# Patient Record
Sex: Female | Born: 1985 | Race: White | Hispanic: No | Marital: Married | Smoking: Never smoker
Health system: Southern US, Community
[De-identification: ages and names within clinical notes are randomized; demographics above are authoritative.]

## PROBLEM LIST (undated history)

## (undated) DIAGNOSIS — R51 Headache: Secondary | ICD-10-CM

## (undated) DIAGNOSIS — N390 Urinary tract infection, site not specified: Secondary | ICD-10-CM

## (undated) HISTORY — DX: Headache: R51

## (undated) HISTORY — DX: Urinary tract infection, site not specified: N39.0

---
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
## 2005-10-29 ENCOUNTER — Other Ambulatory Visit: Admission: RE | Admit: 2005-10-29 | Discharge: 2005-10-29 | Payer: Self-pay | Admitting: Obstetrics and Gynecology

## 2005-12-17 ENCOUNTER — Emergency Department (HOSPITAL_COMMUNITY): Admission: EM | Admit: 2005-12-17 | Discharge: 2005-12-17 | Payer: Self-pay | Admitting: Family Medicine

## 2006-12-09 ENCOUNTER — Other Ambulatory Visit: Admission: RE | Admit: 2006-12-09 | Discharge: 2006-12-09 | Payer: Self-pay | Admitting: Obstetrics and Gynecology

## 2011-01-01 ENCOUNTER — Encounter: Payer: Self-pay | Admitting: Family Medicine

## 2011-01-01 ENCOUNTER — Ambulatory Visit (INDEPENDENT_AMBULATORY_CARE_PROVIDER_SITE_OTHER): Payer: BC Managed Care – PPO | Admitting: Family Medicine

## 2011-01-01 DIAGNOSIS — J309 Allergic rhinitis, unspecified: Secondary | ICD-10-CM

## 2011-01-01 DIAGNOSIS — F419 Anxiety disorder, unspecified: Secondary | ICD-10-CM

## 2011-01-01 DIAGNOSIS — J302 Other seasonal allergic rhinitis: Secondary | ICD-10-CM | POA: Insufficient documentation

## 2011-01-01 DIAGNOSIS — F411 Generalized anxiety disorder: Secondary | ICD-10-CM

## 2011-01-01 DIAGNOSIS — F988 Other specified behavioral and emotional disorders with onset usually occurring in childhood and adolescence: Secondary | ICD-10-CM

## 2011-01-01 DIAGNOSIS — G44209 Tension-type headache, unspecified, not intractable: Secondary | ICD-10-CM

## 2011-01-01 DIAGNOSIS — J45909 Unspecified asthma, uncomplicated: Secondary | ICD-10-CM

## 2011-01-01 MED ORDER — KETOPROFEN 50 MG PO CAPS: 50.0000 mg | ORAL_CAPSULE | Freq: Three times a day (TID) | ORAL | Status: DC | PRN

## 2011-01-01 MED ORDER — CLONAZEPAM 0.5 MG PO TABS: 0.5000 mg | ORAL_TABLET | ORAL | Status: DC | PRN

## 2011-01-01 MED ORDER — OLOPATADINE HCL 0.6 % NA SOLN: 2.0000 | Freq: Two times a day (BID) | NASAL | Status: DC

## 2011-01-01 MED ORDER — ALBUTEROL SULFATE HFA 108 (90 BASE) MCG/ACT IN AERS: 2.0000 | INHALATION_SPRAY | RESPIRATORY_TRACT | Status: AC | PRN

## 2011-01-01 MED ORDER — LEVOCETIRIZINE DIHYDROCHLORIDE 5 MG PO TABS: 5.0000 mg | ORAL_TABLET | Freq: Every day | ORAL | Status: DC

## 2011-01-01 MED ORDER — AMPHETAMINE-DEXTROAMPHETAMINE 5 MG PO TABS: 5.0000 mg | ORAL_TABLET | Freq: Every day | ORAL | Status: DC

## 2011-01-01 MED ORDER — CYCLOBENZAPRINE HCL 10 MG PO TABS: 10.0000 mg | ORAL_TABLET | Freq: Three times a day (TID) | ORAL | Status: DC | PRN

## 2011-01-01 MED ORDER — DESOGEST-ETH ESTRAD TRIPHASIC 0.1/0.125/0.15 -0.025 MG PO TABS: 1.0000 | ORAL_TABLET | Freq: Every day | ORAL | Status: DC

## 2011-01-01 MED ORDER — AMPHETAMINE-DEXTROAMPHET ER 15 MG PO CP24: 15.0000 mg | ORAL_CAPSULE | Freq: Every day | ORAL | Status: DC

## 2011-01-01 NOTE — Assessment & Plan Note (Signed)
Well controlled since starting 5-HTP.  Using Klonopin rarely.  Seeing therapist.  Will follow.

## 2011-01-01 NOTE — Progress Notes (Signed)
  Subjective:    Patient ID: Karen Taylor, female    DOB: 11-18-1985, 25 y.o.   MRN: [id number]  HPI ADD- on Adderall daily.  dx'd in 2006.  Was previously on 20mg - had 'jittery feeling', dose was decreased to 10mg  and increased to 15 mg 1 yr ago.  Denies current palpitations, insomnia.  Feels sxs are well controlled but will note that she loses focus ~4 pm while working at [employer].  Had discussed addition of 5mg  tab for afternoon sxs w/ previous MD.  Anxiety- on Klonopin prn for sxs.  Rarely taking meds.  sxs started in elementary school but wasn't started on meds until 2004.  Is seeing therapist, taking 5-HTP daily which has dramatically reduced need for Klonopin.  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  On Flexeril and Ketoprofen.  Feels NSAID works better than muscle relaxer.  Reports HAs are well controlled.  Seasonal Allergies- taking Xyzal and Flonase daily.  Having itchy, watery eyes- unable to wear contacts.  Has both spring and fall allergies.  Asthma- rarely uses rescue inhaler, had childhood asthma, seems to have outgrown this for the most part unless has URI.   Review of Systems For ROS see HPI     Objective:   Physical Exam  Constitutional: She is oriented to person, place, and time. She appears well-developed and well-nourished. No distress.  HENT:  Head: Normocephalic and atraumatic.  Eyes: Conjunctivae and EOM are normal. Pupils are equal, round, and reactive to light. Right eye exhibits no discharge. Left eye exhibits no discharge.  Neck: Normal range of motion. Neck supple. No thyromegaly present.  Cardiovascular: Normal rate, regular rhythm, normal heart sounds and intact distal pulses.   No murmur heard. Pulmonary/Chest: Effort normal and breath sounds normal. No respiratory distress. She has no wheezes.  Musculoskeletal: She exhibits no edema.  Lymphadenopathy:    She has no cervical adenopathy.  Neurological: She is alert and oriented to  person, place, and time.  Skin: Skin is warm and dry.  Psychiatric: She has a normal mood and affect. Her behavior is normal. Judgment and thought content normal.          Assessment & Plan:

## 2011-01-01 NOTE — Patient Instructions (Signed)
Schedule your complete physical at your convenience- do not eat before this appt Start the nasal spray to treat both your nasal and eye allergies Add the Adderall 5mg  for afternoon concentration Call with any questions or concerns Welcome!  We're glad to have you!

## 2011-01-01 NOTE — Assessment & Plan Note (Signed)
Pt's nasal allergies well controlled on Flonase and Xyzal but her eye allergies remain problematic.  Pt prefers to avoid eye drops so that she can wear her contacts.  Switch to patanase to control both eye and nasal allergies.

## 2011-01-01 NOTE — Assessment & Plan Note (Signed)
Only problematic for pt when she has URI.  Refill on rescue inhaler given.

## 2011-01-01 NOTE — Assessment & Plan Note (Signed)
Pt's medication seems to wear off daily ~4 pm.  Pt had discussed adding short acting med w/ previous MD.  This seems reasonable as she had side effects when her long acting med was increased.  Will follow closely.

## 2011-01-01 NOTE — Assessment & Plan Note (Signed)
HAs improve w/ NSAID use prn.  Will occasionally use muscle relaxer on nights/weekends as needed.  Feels HAs are well controlled.

## 2011-02-16 ENCOUNTER — Encounter: Payer: BC Managed Care – PPO | Admitting: Family Medicine

## 2011-12-11 ENCOUNTER — Telehealth: Payer: Self-pay | Admitting: Family Medicine

## 2011-12-11 NOTE — Telephone Encounter (Signed)
Refill for   Levocetirizine  (Tab)  5 MG  Take 1 tablet (5 mg total) by mouth daily. Qty 30 Last fill date 11/14/11

## 2011-12-12 NOTE — Telephone Encounter (Signed)
Last OV noted 01-01-11 no upcoming apt noted, PCP change noted on 12-11-11,please note

## 2011-12-12 NOTE — Telephone Encounter (Signed)
Ok for #30, no refills.  Remind her she needs appt

## 2011-12-13 MED ORDER — LEVOCETIRIZINE DIHYDROCHLORIDE 5 MG PO TABS: 5.0000 mg | ORAL_TABLET | Freq: Every day | ORAL | Status: DC

## 2011-12-13 NOTE — Telephone Encounter (Signed)
rx sent to pharmacy by e-script for #30 Letter has been mailed to pt address noted in the chart to advise they are overdue for cpe/ov/labs and the pt needs to contact office to set up appt    

## 2011-12-19 ENCOUNTER — Ambulatory Visit (INDEPENDENT_AMBULATORY_CARE_PROVIDER_SITE_OTHER): Payer: BC Managed Care – PPO | Admitting: Internal Medicine

## 2011-12-19 ENCOUNTER — Other Ambulatory Visit: Payer: Self-pay | Admitting: Family Medicine

## 2011-12-19 ENCOUNTER — Encounter: Payer: Self-pay | Admitting: Internal Medicine

## 2011-12-19 VITALS — BP 126/90 | HR 127 | Temp 99.7°F | Wt 272.0 lb

## 2011-12-19 DIAGNOSIS — J029 Acute pharyngitis, unspecified: Secondary | ICD-10-CM

## 2011-12-19 DIAGNOSIS — J020 Streptococcal pharyngitis: Secondary | ICD-10-CM

## 2011-12-19 DIAGNOSIS — R000 Tachycardia, unspecified: Secondary | ICD-10-CM

## 2011-12-19 LAB — POCT RAPID STREP A (OFFICE): Rapid Strep A Screen: POSITIVE — AB

## 2011-12-19 MED ORDER — CEPHALEXIN 500 MG PO CAPS: 500.0000 mg | ORAL_CAPSULE | Freq: Two times a day (BID) | ORAL | Status: AC

## 2011-12-19 NOTE — Patient Instructions (Addendum)
Zicam Melts or Zinc lozenges ; vitamin C 2000 mg daily; & Echinacea for 4-7 days.    NSAIDS ( Aleve, Advil, Naproxen) or Tylenol every 4 hrs as needed for fever as discussed based on label recommendations . Eight oz of fluid every hour while awake.   Avoid stimulants such as decongestants, diet pills, ADD medication  or caffeine (coffee, tea, cola, or chocolate) to excess because of fast heart rate.      Please remain out of work until  12/24/11.

## 2011-12-19 NOTE — Progress Notes (Signed)
  Subjective:    Patient ID: Karen Taylor, female    DOB: 1986/06/16, 26 y.o.   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 She's had some diarrhea but attributes this to being unable to eat.      Objective:   Physical Exam General appearance:well nourished; no acute distress or increased work of breathing is present. IN NO DISTRESS  Tender L posterior cervical  lymphadenopathy ; none noted in  axilla    Eyes: No conjunctival inflammation or lid edema is present.   Ears:  External ear exam shows no significant lesions or deformities.  Otoscopic examination reveals clear canals, tympanic membranes are intact bilaterally without bulging, retraction, inflammation or discharge.  Nose:  External nasal examination shows no deformity or inflammation. Nasal mucosa are pink and moist without lesions or exudates. No septal dislocation or deviation.No obstruction to airflow.   Oral exam: Dental hygiene is good; lips and gums are healthy appearing.There is no uvular erythema w/o exudate  Neck:   Supple with full range of motion without pain.   Heart:  TACHYCARDIA; regular rhythm. Pulse recheck : 115.S1 and S2 normal without  murmur, click, rub .Gallop cadence Lungs:Chest clear to auscultation; no wheezes,  rhonchi,rales ,or rubs present.No increased work of breathing.    Extremities:  No cyanosis, edema, or clubbing  noted    Skin: Warm & dry w/o jaundice or tenting.          Assessment & Plan:   #1 strep pharyngitis with intermittent fever and chills  #2 tachycardia without significant murmur to suggest endocarditis  Plan: See orders and recommendations

## 2011-12-19 NOTE — Telephone Encounter (Signed)
Patient called scheduled CPE for May, can we send a prescription for    desogestrel-ethinyl estradiol (CYCLESSA) tablet    Qty 28  To  [id number] - [place], [place] - [address] To get her thru til may

## 2011-12-20 MED ORDER — DESOGEST-ETH ESTRAD TRIPHASIC 0.1/0.125/0.15 -0.025 MG PO TABS: 1.0000 | ORAL_TABLET | Freq: Every day | ORAL | Status: DC

## 2011-12-20 NOTE — Telephone Encounter (Signed)
rx sent to pharmacy by e-script for #30 with 1 refill to last patient til upcoming OV in 5-13

## 2012-02-08 ENCOUNTER — Encounter: Payer: BC Managed Care – PPO | Admitting: Family Medicine

## 2012-03-14 ENCOUNTER — Encounter: Payer: BC Managed Care – PPO | Admitting: Family Medicine

## 2012-12-16 ENCOUNTER — Ambulatory Visit: Payer: BC Managed Care – PPO | Attending: Orthopedic Surgery | Admitting: Physical Therapy

## 2012-12-16 DIAGNOSIS — IMO0001 Reserved for inherently not codable concepts without codable children: Secondary | ICD-10-CM | POA: Insufficient documentation

## 2012-12-16 DIAGNOSIS — M25569 Pain in unspecified knee: Secondary | ICD-10-CM | POA: Insufficient documentation

## 2012-12-23 ENCOUNTER — Ambulatory Visit: Payer: BC Managed Care – PPO | Admitting: Physical Therapy

## 2012-12-30 ENCOUNTER — Ambulatory Visit: Payer: BC Managed Care – PPO | Attending: Orthopedic Surgery | Admitting: Physical Therapy

## 2012-12-30 DIAGNOSIS — IMO0001 Reserved for inherently not codable concepts without codable children: Secondary | ICD-10-CM | POA: Insufficient documentation

## 2012-12-30 DIAGNOSIS — M25569 Pain in unspecified knee: Secondary | ICD-10-CM | POA: Insufficient documentation

## 2013-01-06 ENCOUNTER — Ambulatory Visit: Payer: BC Managed Care – PPO | Admitting: Physical Therapy

## 2015-03-27 ENCOUNTER — Encounter (HOSPITAL_COMMUNITY): Payer: Self-pay | Admitting: Emergency Medicine

## 2015-03-27 ENCOUNTER — Emergency Department (HOSPITAL_COMMUNITY): Payer: 59

## 2015-03-27 ENCOUNTER — Observation Stay (HOSPITAL_COMMUNITY)
Admission: EM | Admit: 2015-03-27 | Discharge: 2015-03-29 | Disposition: A | Discharge status: Home / Self Care | Payer: 59 | Attending: Surgery | Admitting: Surgery

## 2015-03-27 DIAGNOSIS — K810 Acute cholecystitis: Secondary | ICD-10-CM | POA: Diagnosis present

## 2015-03-27 DIAGNOSIS — K8010 Calculus of gallbladder with chronic cholecystitis without obstruction: Secondary | ICD-10-CM | POA: Diagnosis present

## 2015-03-27 DIAGNOSIS — K819 Cholecystitis, unspecified: Secondary | ICD-10-CM

## 2015-03-27 DIAGNOSIS — K8000 Calculus of gallbladder with acute cholecystitis without obstruction: Principal | ICD-10-CM | POA: Insufficient documentation

## 2015-03-27 DIAGNOSIS — Z6841 Body Mass Index (BMI) 40.0 and over, adult: Secondary | ICD-10-CM | POA: Insufficient documentation

## 2015-03-27 LAB — COMPREHENSIVE METABOLIC PANEL
ALBUMIN: 3.6 g/dL (ref 3.5–5.0)
ALT: 20 U/L (ref 14–54)
ANION GAP: 9 (ref 5–15)
AST: 23 U/L (ref 15–41)
Alkaline Phosphatase: 69 U/L (ref 38–126)
BILIRUBIN TOTAL: 0.3 mg/dL (ref 0.3–1.2)
BUN: 9 mg/dL (ref 6–20)
CHLORIDE: 104 mmol/L (ref 101–111)
CO2: 25 mmol/L (ref 22–32)
Calcium: 9.1 mg/dL (ref 8.9–10.3)
Creatinine, Ser: 0.76 mg/dL (ref 0.44–1.00)
GFR calc Af Amer: >60 mL/min (ref >60)
Glucose, Bld: 100 mg/dL — ABNORMAL HIGH (ref 65–99)
Potassium: 3.8 mmol/L (ref 3.5–5.1)
Sodium: 138 mmol/L (ref 135–145)
Total Protein: 6.7 g/dL (ref 6.5–8.1)

## 2015-03-27 LAB — CBC WITH DIFFERENTIAL/PLATELET
BASOS PCT: 0 % (ref 0–1)
Basophils Absolute: 0 10*3/uL (ref 0.0–0.1)
EOS ABS: 0.2 10*3/uL (ref 0.0–0.7)
EOS PCT: 2 % (ref 0–5)
HCT: 40.7 % (ref 36.0–46.0)
HEMOGLOBIN: 13.5 g/dL (ref 12.0–15.0)
Lymphocytes Relative: 31 % (ref 12–46)
Lymphs Abs: 3.1 10*3/uL (ref 0.7–4.0)
MCH: 29.3 pg (ref 26.0–34.0)
MCHC: 33.2 g/dL (ref 30.0–36.0)
MCV: 88.3 fL (ref 78.0–100.0)
MONO ABS: 0.8 10*3/uL (ref 0.1–1.0)
Monocytes Relative: 8 % (ref 3–12)
Neutro Abs: 6 10*3/uL (ref 1.7–7.7)
Neutrophils Relative %: 59 % (ref 43–77)
Platelets: 429 10*3/uL — ABNORMAL HIGH (ref 150–400)
RBC: 4.61 MIL/uL (ref 3.87–5.11)
RDW: 14.9 % (ref 11.5–15.5)
WBC: 10.2 10*3/uL (ref 4.0–10.5)

## 2015-03-27 LAB — SURGICAL PCR SCREEN
MRSA, PCR: NEGATIVE
Staphylococcus aureus: NEGATIVE

## 2015-03-27 LAB — CREATININE, SERUM
Creatinine, Ser: 0.7 mg/dL (ref 0.44–1.00)
GFR calc Af Amer: >60 mL/min (ref >60)
GFR calc non Af Amer: >60 mL/min (ref >60)

## 2015-03-27 LAB — URINALYSIS, ROUTINE W REFLEX MICROSCOPIC
Bilirubin Urine: NEGATIVE
Glucose, UA: NEGATIVE mg/dL
Hgb urine dipstick: NEGATIVE
Ketones, ur: NEGATIVE mg/dL
Leukocytes, UA: NEGATIVE
Nitrite: NEGATIVE
Protein, ur: NEGATIVE mg/dL
Specific Gravity, Urine: 1.025 (ref 1.005–1.030)
Urobilinogen, UA: 0.2 mg/dL (ref 0.0–1.0)
pH: 6 (ref 5.0–8.0)

## 2015-03-27 LAB — CBC
HCT: 41.1 % (ref 36.0–46.0)
Hemoglobin: 13.5 g/dL (ref 12.0–15.0)
MCH: 28.8 pg (ref 26.0–34.0)
MCHC: 32.8 g/dL (ref 30.0–36.0)
MCV: 87.8 fL (ref 78.0–100.0)
Platelets: 420 10*3/uL — ABNORMAL HIGH (ref 150–400)
RBC: 4.68 MIL/uL (ref 3.87–5.11)
RDW: 15 % (ref 11.5–15.5)
WBC: 13.7 10*3/uL — ABNORMAL HIGH (ref 4.0–10.5)

## 2015-03-27 LAB — LIPASE, BLOOD: Lipase: 20 U/L — ABNORMAL LOW (ref 22–51)

## 2015-03-27 LAB — POC URINE PREG, ED: PREG TEST UR: NEGATIVE

## 2015-03-27 MED ORDER — ACETAMINOPHEN 325 MG PO TABS
650.0000 mg | ORAL_TABLET | Freq: Four times a day (QID) | ORAL | Status: DC | PRN
  Administered 2015-03-28: 650 mg via ORAL
  Filled 2015-03-27: qty 2

## 2015-03-27 MED ORDER — FENTANYL CITRATE (PF) 100 MCG/2ML IJ SOLN
100.0000 ug | Freq: Once | INTRAMUSCULAR | Status: AC
  Administered 2015-03-27: 100 ug via INTRAVENOUS
  Filled 2015-03-27: qty 2

## 2015-03-27 MED ORDER — CIPROFLOXACIN IN D5W 400 MG/200ML IV SOLN
400.0000 mg | Freq: Two times a day (BID) | INTRAVENOUS | Status: DC
  Administered 2015-03-27 – 2015-03-29 (×4): 400 mg via INTRAVENOUS
  Filled 2015-03-27 (×6): qty 200

## 2015-03-27 MED ORDER — OXYCODONE HCL 5 MG PO TABS
5.0000 mg | ORAL_TABLET | ORAL | Status: DC | PRN
  Administered 2015-03-29 (×2): 5 mg via ORAL
  Filled 2015-03-27 (×2): qty 1

## 2015-03-27 MED ORDER — ONDANSETRON HCL 4 MG/2ML IJ SOLN
4.0000 mg | Freq: Four times a day (QID) | INTRAMUSCULAR | Status: DC | PRN
  Administered 2015-03-27: 4 mg via INTRAVENOUS
  Filled 2015-03-27: qty 2

## 2015-03-27 MED ORDER — IOHEXOL 300 MG/ML  SOLN
100.0000 mL | Freq: Once | INTRAMUSCULAR | Status: AC | PRN
  Administered 2015-03-27: 100 mL via INTRAVENOUS

## 2015-03-27 MED ORDER — ENOXAPARIN SODIUM 40 MG/0.4ML ~~LOC~~ SOLN: 40.0000 mg | SUBCUTANEOUS | Status: DC

## 2015-03-27 MED ORDER — HYDROMORPHONE HCL 1 MG/ML IJ SOLN
1.0000 mg | INTRAMUSCULAR | Status: DC | PRN
  Administered 2015-03-27 – 2015-03-29 (×11): 1 mg via INTRAVENOUS
  Filled 2015-03-27 (×11): qty 1

## 2015-03-27 MED ORDER — ACETAMINOPHEN 650 MG RE SUPP: 650.0000 mg | Freq: Four times a day (QID) | RECTAL | Status: DC | PRN

## 2015-03-27 MED ORDER — KCL IN DEXTROSE-NACL 20-5-0.9 MEQ/L-%-% IV SOLN
INTRAVENOUS | Status: DC
  Administered 2015-03-27 – 2015-03-28 (×2): via INTRAVENOUS
  Filled 2015-03-27 (×6): qty 1000

## 2015-03-27 MED ORDER — ONDANSETRON HCL 4 MG/2ML IJ SOLN
4.0000 mg | Freq: Once | INTRAMUSCULAR | Status: AC
  Administered 2015-03-27: 4 mg via INTRAVENOUS
  Filled 2015-03-27: qty 2

## 2015-03-27 MED ORDER — SODIUM CHLORIDE 0.9 % IV BOLUS (SEPSIS)
1000.0000 mL | Freq: Once | INTRAVENOUS | Status: AC
  Administered 2015-03-27: 1000 mL via INTRAVENOUS

## 2015-03-27 MED ORDER — MORPHINE SULFATE 4 MG/ML IJ SOLN
4.0000 mg | Freq: Once | INTRAMUSCULAR | Status: AC
  Administered 2015-03-27: 4 mg via INTRAVENOUS
  Filled 2015-03-27: qty 1

## 2015-03-27 NOTE — ED Notes (Signed)
Pt states- I woke up about 5am with sharp right lower abdominal pain that has gotten worse since then with 1 episode of vomiting. Last BM today, normal.  Afebrile. Pt rates pain 7/10 constant and took two ibuprofen pta but then vomited after.

## 2015-03-27 NOTE — ED Notes (Signed)
Pt requesting pain and nausea medication. Attempted to notify PA.

## 2015-03-27 NOTE — ED Notes (Signed)
Pt transporting to CT  

## 2015-03-27 NOTE — ED Provider Notes (Signed)
CSN: [id number]     Arrival date & time 03/27/15  0730 History   First MD Initiated Contact with Patient 03/27/15 [phone]     Chief Complaint  Patient presents with  . Abdominal Pain     (Consider location/radiation/quality/duration/timing/severity/associated sxs/prior Treatment) HPI Patient presents to the emergency department with sudden onset of right-sided abdominal pain around 5 AM the patient states that she was oblique in by the pain at 5 AM she states that it was mainly right-sided abdominal pain.  She states that she did not have nausea and 1 episode of vomiting.  Patient states she had a bowel movement this morning, attempting to relieve her symptoms without improvement.  The patient states that she does not have any chest pain, shortness of breath, back pain, neck pain, fever, dysuria, hematuria, incontinence, weakness, dizziness, or syncope.  The patient states that she did not take any medications prior to arrival for her symptoms Past Medical History  Diagnosis Date  . Asthma   . Headache(784.0)   . UTI (lower urinary tract infection)   . Migraine    Past Surgical History  Procedure Laterality Date  . Nasal septum surgery  2003  . Wisdom tooth extraction  2005  . Tonsillectomy and adenoidectomy  1994   Family History  Problem Relation Age of Onset  . Sudden death Mother   . Mental illness Mother     bi-polar  . Diabetes Maternal Grandfather    History  Substance Use Topics  . Smoking status: Never Smoker   . Smokeless tobacco: Not on file  . Alcohol Use: Yes     Comment: wine on weekends   OB History    No data available     Review of Systems  All other systems negative except as documented in the HPI. All pertinent positives and negatives as reviewed in the HPI.  Allergies  Review of patient's allergies indicates no known allergies.  Home Medications   Prior to Admission medications   Medication Sig Start Date End Date Taking? Authorizing Provider   albuterol (PROAIR HFA) 108 (90 BASE) MCG/ACT inhaler Inhale 2 puffs into the lungs as needed. 01/01/11  Yes Sheliah Hatch, MD  ibuprofen (ADVIL,MOTRIN) 200 MG tablet Take 400 mg by mouth every 6 (six) hours as needed for mild pain or moderate pain.   Yes Historical Provider, MD   BP 139/85 mmHg  Pulse 100  Temp(Src) 98.6 F (37 C) (Oral)  Resp 19  Ht 5\' 8"  (1.727 m)  Wt 272 lb (123.378 kg)  BMI 41.37 kg/m2  SpO2 97%  LMP 03/06/2015 Physical Exam  Constitutional: She is oriented to person, place, and time. She appears well-developed and well-nourished. No distress.  HENT:  Head: Normocephalic and atraumatic.  Mouth/Throat: Oropharynx is clear and moist.  Eyes: Pupils are equal, round, and reactive to light.  Neck: Normal range of motion. Neck supple.  Cardiovascular: Normal rate, regular rhythm and normal heart sounds.  Exam reveals no gallop and no friction rub.   No murmur heard. Pulmonary/Chest: Effort normal and breath sounds normal. No respiratory distress.  Musculoskeletal: She exhibits no edema.  Neurological: She is alert and oriented to person, place, and time. She exhibits normal muscle tone. Coordination normal.  Skin: Skin is warm and dry. No rash noted. No erythema.  Psychiatric: She has a normal mood and affect. Her behavior is normal.  Nursing note and vitals reviewed.   ED Course  Procedures (including critical care time) Labs Review Labs  Reviewed  CBC WITH DIFFERENTIAL/PLATELET - Abnormal; Notable for the following:    Platelets 429 (*)    All other components within normal limits  COMPREHENSIVE METABOLIC PANEL - Abnormal; Notable for the following:    Glucose, Bld 100 (*)    All other components within normal limits  LIPASE, BLOOD - Abnormal; Notable for the following:    Lipase 20 (*)    All other components within normal limits  URINALYSIS, ROUTINE W REFLEX MICROSCOPIC (NOT AT [facility]) - Abnormal; Notable for the following:    APPearance CLOUDY (*)     All other components within normal limits  POC URINE PREG, ED    Imaging Review US Abdomen Complete  03/27/2015   CLINICAL DATA:  Right upper quadrant pain  EXAM: ULTRASOUND ABDOMEN COMPLETE  COMPARISON:  None.  FINDINGS: Gallbladder: Stone within the neck of gallbladder measures 1.6 cm. Gallbladder wall is upper limits of normal in thickness measuring 3 mm. No sonographic Murphy sign noted.  Common bile duct: Diameter: 3 mm.  Liver: No focal lesion identified. Within normal limits in parenchymal echogenicity.  IVC: No abnormality visualized.  Pancreas: Visualized portion unremarkable.  Spleen: Size and appearance within normal limits.  Right Kidney: Length: 11.2 cm. Mild pelviectasis. Echogenicity within normal limits. No mass or hydronephrosis visualized.  Left Kidney: Length: 12.2 cm. Echogenicity within normal limits. No mass or hydronephrosis visualized.  Abdominal aorta: No aneurysm visualized.  Other findings: None.  IMPRESSION: 1. Gallstone noted within the gallbladder neck. The gallbladder wall is upper limits of normal in thickness measuring 3 mm. Correlate for any clinical signs or symptoms of cholecystitis. 2. Gallstones.   Electronically Signed   By: Signa Kell M.D.   On: 03/27/2015 12:03   Ct Abdomen Pelvis W Contrast  03/27/2015   CLINICAL DATA:  Right lower quadrant abdominal pain.  Vomiting.  EXAM: CT ABDOMEN AND PELVIS WITH CONTRAST  TECHNIQUE: Multidetector CT imaging of the abdomen and pelvis was performed using the standard protocol following bolus administration of intravenous contrast.  CONTRAST:  OMNIPAQUE IOHEXOL 300 MG/ML  SOLN  COMPARISON:  None.  FINDINGS: Normal hepatic contour. No discrete hepatic lesions. Normal appearance of the gallbladder. No radiopaque gallstones. No intra extrahepatic biliary duct dilatation. No ascites.  There is symmetric enhancement of the bilateral kidneys. No renal stones on this postcontrast examination. No discrete renal lesions. No  urinary obstruction or perinephric stranding. Normal appearance of the bilateral adrenal glands, pancreas and spleen. Incidental note is made of several small splenules.  The bowel is normal in course and caliber without wall thickening or evidence of obstruction. Normal appearance appendix. No pneumoperitoneum, pneumatosis or portal venous gas.  Normal caliber the abdominal aorta. The major branch vessels of the abdominal aorta appear patent on this non CTA examination.  Scattered retroperitoneal lymph nodes individually not enlarged by size criteria. No bulky retroperitoneal, mesenteric, pelvic or inguinal lymphadenopathy.  Note is made of a approximately 1.3 x 1.6 cm left-sided presumably physiologic adnexal cyst (image 85, series 201). No discrete right-sided adnexal lesions. No free fluid the pelvic cul-de-sac. Several phleboliths are seen the lower pelvis bilaterally. Normal appearance of the urinary bladder given degree distention.  Limited visualization the lower thorax demonstrates minimal dependent ground-glass atelectasis. No discrete focal airspace opacities. No pleural effusion.  Normal heart size.  No pericardial effusion.  No acute or aggressive osseous abnormalities. Tiny mesenteric fat containing periumbilical hernia.  IMPRESSION: 1. No explanation for patient's right lower quadrant abdominal pain. Specifically, no  evidence of enteric or urinary obstruction. Normal appearance of the appendix. 2. Incidentally noted approximately 1.6 cm left-sided presumably physiologic adnexal cyst.   Electronically Signed   By: Simonne Come M.D.   On: 03/27/2015 10:22    I spoke with general surgery about the patient and they will be down to evaluate her, I got an ultrasound following the CT scan due to the fact that she had right-sided abdominal pain and did have some upper abdominal pain as well.  The patient.  Patient did have gallstones, along with a gallstone stuck in the gallbladder neck with mild  inflammation of the gallbladder, a is reexamined 2) on each reexamination, her pain is better but is still present  Charlestine Night, PA-C 03/27/15 1344  Benjiman Core, MD 03/27/15 1529

## 2015-03-27 NOTE — H&P (Signed)
Karen Taylor is an 29 y.o. female.   Chief Complaint: RUQ abdominal pain since 4 am  HPI: asked to see the patient at the request of Lawyer PA for RUQ pain abdomne sice 4 am.  Found to have gallstones on [place] with borderline GB wall thickening.  Pain sharp severe in RUQ  made better with pain meds but returns when meds  Wear off.  Had [place] steak sandwich at dinner.  First time this has happened.   Past Medical History  Diagnosis Date  . Asthma   . Headache(784.0)   . UTI (lower urinary tract infection)   . Migraine     Past Surgical History  Procedure Laterality Date  . Nasal septum surgery  2003  . Wisdom tooth extraction  2005  . Tonsillectomy and adenoidectomy  1994    Family History  Problem Relation Age of Onset  . Sudden death Mother   . Mental illness Mother     bi-polar  . Diabetes Maternal Grandfather    Social History:  reports that she has never smoked. She does not have any smokeless tobacco history on file. She reports that she drinks alcohol. She reports that she does not use illicit drugs.  Allergies: No Known Allergies   (Not in a hospital admission)  Results for orders placed or performed during the hospital encounter of 03/27/15 (from the past 48 hour(s))  CBC with Differential     Status: Abnormal   Collection Time: 03/27/15  7:52 AM  Result Value Ref Range   WBC 10.2 4.0 - 10.5 K/uL   RBC 4.61 3.87 - 5.11 MIL/uL   Hemoglobin 13.5 12.0 - 15.0 g/dL   HCT 40.7 36.0 - 46.0 %   MCV 88.3 78.0 - 100.0 fL   MCH 29.3 26.0 - 34.0 pg   MCHC 33.2 30.0 - 36.0 g/dL   RDW 14.9 11.5 - 15.5 %   Platelets 429 (H) 150 - 400 K/uL   Neutrophils Relative % 59 43 - 77 %   Neutro Abs 6.0 1.7 - 7.7 K/uL   Lymphocytes Relative 31 12 - 46 %   Lymphs Abs 3.1 0.7 - 4.0 K/uL   Monocytes Relative 8 3 - 12 %   Monocytes Absolute 0.8 0.1 - 1.0 K/uL   Eosinophils Relative 2 0 - 5 %   Eosinophils Absolute 0.2 0.0 - 0.7 K/uL   Basophils Relative 0 0 - 1 %   Basophils Absolute  0.0 0.0 - 0.1 K/uL  Comprehensive metabolic panel     Status: Abnormal   Collection Time: 03/27/15  7:52 AM  Result Value Ref Range   Sodium 138 135 - 145 mmol/L   Potassium 3.8 3.5 - 5.1 mmol/L   Chloride 104 101 - 111 mmol/L   CO2 25 22 - 32 mmol/L   Glucose, Bld 100 (H) 65 - 99 mg/dL   BUN 9 6 - 20 mg/dL   Creatinine, Ser 0.76 0.44 - 1.00 mg/dL   Calcium 9.1 8.9 - 10.3 mg/dL   Total Protein 6.7 6.5 - 8.1 g/dL   Albumin 3.6 3.5 - 5.0 g/dL   AST 23 15 - 41 U/L   ALT 20 14 - 54 U/L   Alkaline Phosphatase 69 38 - 126 U/L   Total Bilirubin 0.3 0.3 - 1.2 mg/dL   GFR calc non Af Amer >60 >60 mL/min   GFR calc Af Amer >60 >60 mL/min    Comment: (NOTE) The eGFR has been calculated using the  CKD EPI equation. This calculation has not been validated in all clinical situations. eGFR's persistently <60 mL/min signify possible Chronic Kidney Disease.    Anion gap 9 5 - 15  Urinalysis, Routine w reflex microscopic (not at [facility])     Status: Abnormal   Collection Time: 03/27/15  8:00 AM  Result Value Ref Range   Color, Urine YELLOW YELLOW   APPearance CLOUDY (A) CLEAR   Specific Gravity, Urine 1.025 1.005 - 1.030   pH 6.0 5.0 - 8.0   Glucose, UA NEGATIVE NEGATIVE mg/dL   Hgb urine dipstick NEGATIVE NEGATIVE   Bilirubin Urine NEGATIVE NEGATIVE   Ketones, ur NEGATIVE NEGATIVE mg/dL   Protein, ur NEGATIVE NEGATIVE mg/dL   Urobilinogen, UA 0.2 0.0 - 1.0 mg/dL   Nitrite NEGATIVE NEGATIVE   Leukocytes, UA NEGATIVE NEGATIVE    Comment: MICROSCOPIC NOT DONE ON URINES WITH NEGATIVE PROTEIN, BLOOD, LEUKOCYTES, NITRITE, OR GLUCOSE <1000 mg/dL.  POC Urine Pregnancy, ED  (If Pre-menopausal female)  not at [facility]     Status: None   Collection Time: 03/27/15  8:10 AM  Result Value Ref Range   Preg Test, Ur NEGATIVE NEGATIVE    Comment:        THE SENSITIVITY OF THIS METHODOLOGY IS >24 mIU/mL   Lipase, blood     Status: Abnormal   Collection Time: 03/27/15 10:30 AM  Result Value Ref Range    Lipase 20 (L) 22 - 51 U/L   US Abdomen Complete  03/27/2015   CLINICAL DATA:  Right upper quadrant pain  EXAM: ULTRASOUND ABDOMEN COMPLETE  COMPARISON:  None.  FINDINGS: Gallbladder: Stone within the neck of gallbladder measures 1.6 cm. Gallbladder wall is upper limits of normal in thickness measuring 3 mm. No sonographic Murphy sign noted.  Common bile duct: Diameter: 3 mm.  Liver: No focal lesion identified. Within normal limits in parenchymal echogenicity.  IVC: No abnormality visualized.  Pancreas: Visualized portion unremarkable.  Spleen: Size and appearance within normal limits.  Right Kidney: Length: 11.2 cm. Mild pelviectasis. Echogenicity within normal limits. No mass or hydronephrosis visualized.  Left Kidney: Length: 12.2 cm. Echogenicity within normal limits. No mass or hydronephrosis visualized.  Abdominal aorta: No aneurysm visualized.  Other findings: None.  IMPRESSION: 1. Gallstone noted within the gallbladder neck. The gallbladder wall is upper limits of normal in thickness measuring 3 mm. Correlate for any clinical signs or symptoms of cholecystitis. 2. Gallstones.   Electronically Signed   By: Kerby Moors M.D.   On: 03/27/2015 12:03   Ct Abdomen Pelvis W Contrast  03/27/2015   CLINICAL DATA:  Right lower quadrant abdominal pain.  Vomiting.  EXAM: CT ABDOMEN AND PELVIS WITH CONTRAST  TECHNIQUE: Multidetector CT imaging of the abdomen and pelvis was performed using the standard protocol following bolus administration of intravenous contrast.  CONTRAST:  170mL OMNIPAQUE IOHEXOL 300 MG/ML  SOLN  COMPARISON:  None.  FINDINGS: Normal hepatic contour. No discrete hepatic lesions. Normal appearance of the gallbladder. No radiopaque gallstones. No intra extrahepatic biliary duct dilatation. No ascites.  There is symmetric enhancement of the bilateral kidneys. No renal stones on this postcontrast examination. No discrete renal lesions. No urinary obstruction or perinephric stranding. Normal  appearance of the bilateral adrenal glands, pancreas and spleen. Incidental note is made of several small splenules.  The bowel is normal in course and caliber without wall thickening or evidence of obstruction. Normal appearance appendix. No pneumoperitoneum, pneumatosis or portal venous gas.  Normal caliber the abdominal aorta. The  major branch vessels of the abdominal aorta appear patent on this non CTA examination.  Scattered retroperitoneal lymph nodes individually not enlarged by size criteria. No bulky retroperitoneal, mesenteric, pelvic or inguinal lymphadenopathy.  Note is made of a approximately 1.3 x 1.6 cm left-sided presumably physiologic adnexal cyst (image 85, series 201). No discrete right-sided adnexal lesions. No free fluid the pelvic cul-de-sac. Several phleboliths are seen the lower pelvis bilaterally. Normal appearance of the urinary bladder given degree distention.  Limited visualization the lower thorax demonstrates minimal dependent ground-glass atelectasis. No discrete focal airspace opacities. No pleural effusion.  Normal heart size.  No pericardial effusion.  No acute or aggressive osseous abnormalities. Tiny mesenteric fat containing periumbilical hernia.  IMPRESSION: 1. No explanation for patient's right lower quadrant abdominal pain. Specifically, no evidence of enteric or urinary obstruction. Normal appearance of the appendix. 2. Incidentally noted approximately 1.6 cm left-sided presumably physiologic adnexal cyst.   Electronically Signed   By: Sandi Mariscal M.D.   On: 03/27/2015 10:22    Review of Systems  Constitutional: Negative for fever and chills.  HENT: Negative.   Respiratory: Negative.   Cardiovascular: Negative.   Gastrointestinal: Positive for nausea, vomiting and abdominal pain.  Musculoskeletal: Negative.   Skin: Negative.   Psychiatric/Behavioral: Negative.     Blood pressure 139/85, pulse 100, temperature 98.6 F (37 C), temperature source Oral, resp. rate  19, height $RemoveBe'5\' 8"'BKoubavZs$  (1.727 m), weight 123.378 kg (272 lb), last menstrual period 03/06/2015, SpO2 97 %. Physical Exam  Constitutional: She is oriented to person, place, and time. She appears well-developed and well-nourished.  HENT:  Head: Normocephalic and atraumatic.  Eyes: No scleral icterus.  Neck: Normal range of motion. Neck supple.  Cardiovascular: Normal rate.   Respiratory: Effort normal.  GI: There is tenderness. There is positive Murphy's sign.  Musculoskeletal: Normal range of motion.  Neurological: She is alert and oriented to person, place, and time.  Skin: Skin is warm and dry.  Psychiatric: She has a normal mood and affect. Her behavior is normal. Judgment and thought content normal.     Assessment/Plan Acute cholecystitis Admit ABX Lap cholecystectomy moday   Rosella Crandell A. 03/27/2015, 2:03 PM

## 2015-03-28 ENCOUNTER — Observation Stay (HOSPITAL_COMMUNITY): Payer: 59 | Admitting: Anesthesiology

## 2015-03-28 ENCOUNTER — Encounter (HOSPITAL_COMMUNITY)
Admission: EM | Disposition: A | Discharge status: Home / Self Care | Payer: Self-pay | Source: Home / Self Care | Attending: Emergency Medicine

## 2015-03-28 ENCOUNTER — Observation Stay (HOSPITAL_COMMUNITY): Payer: 59

## 2015-03-28 ENCOUNTER — Encounter (HOSPITAL_COMMUNITY): Payer: Self-pay | Admitting: Certified Registered Nurse Anesthetist

## 2015-03-28 HISTORY — PX: CHOLECYSTECTOMY: SHX55

## 2015-03-28 LAB — CBC
HCT: 38.9 % (ref 36.0–46.0)
Hemoglobin: 12.6 g/dL (ref 12.0–15.0)
MCH: 29 pg (ref 26.0–34.0)
MCHC: 32.4 g/dL (ref 30.0–36.0)
MCV: 89.4 fL (ref 78.0–100.0)
Platelets: 397 10*3/uL (ref 150–400)
RBC: 4.35 MIL/uL (ref 3.87–5.11)
RDW: 15.3 % (ref 11.5–15.5)
WBC: 15.3 10*3/uL — ABNORMAL HIGH (ref 4.0–10.5)

## 2015-03-28 LAB — COMPREHENSIVE METABOLIC PANEL
ALT: 37 U/L (ref 14–54)
AST: 40 U/L (ref 15–41)
Albumin: 3.3 g/dL — ABNORMAL LOW (ref 3.5–5.0)
Alkaline Phosphatase: 71 U/L (ref 38–126)
Anion gap: 6 (ref 5–15)
CO2: 27 mmol/L (ref 22–32)
Calcium: 8.7 mg/dL — ABNORMAL LOW (ref 8.9–10.3)
Chloride: 101 mmol/L (ref 101–111)
Creatinine, Ser: 0.81 mg/dL (ref 0.44–1.00)
GFR calc non Af Amer: >60 mL/min (ref >60)
Glucose, Bld: 110 mg/dL — ABNORMAL HIGH (ref 65–99)
Potassium: 4.7 mmol/L (ref 3.5–5.1)
SODIUM: 134 mmol/L — AB (ref 135–145)
TOTAL PROTEIN: 6.8 g/dL (ref 6.5–8.1)
Total Bilirubin: 0.5 mg/dL (ref 0.3–1.2)

## 2015-03-28 SURGERY — LAPAROSCOPIC CHOLECYSTECTOMY WITH INTRAOPERATIVE CHOLANGIOGRAM
Anesthesia: General | Site: Abdomen

## 2015-03-28 MED ORDER — GLYCOPYRROLATE 0.2 MG/ML IJ SOLN
INTRAMUSCULAR | Status: AC
  Filled 2015-03-28: qty 3

## 2015-03-28 MED ORDER — FENTANYL CITRATE (PF) 250 MCG/5ML IJ SOLN
INTRAMUSCULAR | Status: AC
  Filled 2015-03-28: qty 5

## 2015-03-28 MED ORDER — HYDROMORPHONE HCL 1 MG/ML IJ SOLN
INTRAMUSCULAR | Status: AC
  Administered 2015-03-28: 1 mg via INTRAVENOUS
  Filled 2015-03-28: qty 1

## 2015-03-28 MED ORDER — SUCCINYLCHOLINE CHLORIDE 20 MG/ML IJ SOLN
INTRAMUSCULAR | Status: DC | PRN
  Administered 2015-03-28: 120 mg via INTRAVENOUS

## 2015-03-28 MED ORDER — HYDROMORPHONE HCL 1 MG/ML IJ SOLN
0.2500 mg | INTRAMUSCULAR | Status: DC | PRN
  Administered 2015-03-28 (×2): 0.5 mg via INTRAVENOUS

## 2015-03-28 MED ORDER — FENTANYL CITRATE (PF) 100 MCG/2ML IJ SOLN
INTRAMUSCULAR | Status: DC | PRN
  Administered 2015-03-28 (×2): 50 ug via INTRAVENOUS
  Administered 2015-03-28: 200 ug via INTRAVENOUS
  Administered 2015-03-28: 100 ug via INTRAVENOUS
  Administered 2015-03-28 (×2): 50 ug via INTRAVENOUS

## 2015-03-28 MED ORDER — LIDOCAINE HCL (CARDIAC) 20 MG/ML IV SOLN
INTRAVENOUS | Status: DC | PRN
  Administered 2015-03-28: 40 mg via INTRAVENOUS

## 2015-03-28 MED ORDER — PHENYLEPHRINE HCL 10 MG/ML IJ SOLN
INTRAMUSCULAR | Status: DC | PRN
  Administered 2015-03-28: 40 ug via INTRAVENOUS
  Administered 2015-03-28: 80 ug via INTRAVENOUS

## 2015-03-28 MED ORDER — HEMOSTATIC AGENTS (NO CHARGE) OPTIME
TOPICAL | Status: DC | PRN
  Administered 2015-03-28: 1 via TOPICAL

## 2015-03-28 MED ORDER — ROCURONIUM BROMIDE 50 MG/5ML IV SOLN
INTRAVENOUS | Status: AC
  Filled 2015-03-28: qty 1

## 2015-03-28 MED ORDER — SODIUM CHLORIDE 0.9 % IR SOLN
Status: DC | PRN
  Administered 2015-03-28: 1000 mL

## 2015-03-28 MED ORDER — NEOSTIGMINE METHYLSULFATE 10 MG/10ML IV SOLN
INTRAVENOUS | Status: AC
  Filled 2015-03-28: qty 1

## 2015-03-28 MED ORDER — ONDANSETRON HCL 4 MG/2ML IJ SOLN: 4.0000 mg | Freq: Once | INTRAMUSCULAR | Status: DC | PRN

## 2015-03-28 MED ORDER — ONDANSETRON HCL 4 MG/2ML IJ SOLN
INTRAMUSCULAR | Status: AC
  Filled 2015-03-28: qty 2

## 2015-03-28 MED ORDER — BUPIVACAINE-EPINEPHRINE (PF) 0.25% -1:200000 IJ SOLN
INTRAMUSCULAR | Status: AC
  Filled 2015-03-28: qty 30

## 2015-03-28 MED ORDER — PROPOFOL 10 MG/ML IV BOLUS
INTRAVENOUS | Status: AC
  Filled 2015-03-28: qty 20

## 2015-03-28 MED ORDER — LACTATED RINGERS IV SOLN
INTRAVENOUS | Status: DC | PRN
  Administered 2015-03-28 (×2): via INTRAVENOUS

## 2015-03-28 MED ORDER — NEOSTIGMINE METHYLSULFATE 10 MG/10ML IV SOLN
INTRAVENOUS | Status: DC | PRN
  Administered 2015-03-28: 4 mg via INTRAVENOUS

## 2015-03-28 MED ORDER — GLYCOPYRROLATE 0.2 MG/ML IJ SOLN
INTRAMUSCULAR | Status: DC | PRN
  Administered 2015-03-28: 0.6 mg via INTRAVENOUS

## 2015-03-28 MED ORDER — ONDANSETRON HCL 4 MG/2ML IJ SOLN
INTRAMUSCULAR | Status: DC | PRN
  Administered 2015-03-28: 4 mg via INTRAVENOUS

## 2015-03-28 MED ORDER — PROPOFOL 10 MG/ML IV BOLUS
INTRAVENOUS | Status: DC | PRN
  Administered 2015-03-28: 180 mg via INTRAVENOUS
  Administered 2015-03-28: 20 mg via INTRAVENOUS

## 2015-03-28 MED ORDER — MIDAZOLAM HCL 2 MG/2ML IJ SOLN
INTRAMUSCULAR | Status: AC
  Filled 2015-03-28: qty 2

## 2015-03-28 MED ORDER — BUPIVACAINE-EPINEPHRINE 0.25% -1:200000 IJ SOLN
INTRAMUSCULAR | Status: DC | PRN
Start: 2015-03-28 — End: 2015-03-28
  Administered 2015-03-28: 17 mL

## 2015-03-28 MED ORDER — SCOPOLAMINE 1 MG/3DAYS TD PT72
MEDICATED_PATCH | TRANSDERMAL | Status: AC
  Filled 2015-03-28: qty 2

## 2015-03-28 MED ORDER — SCOPOLAMINE 1 MG/3DAYS TD PT72
MEDICATED_PATCH | TRANSDERMAL | Status: DC | PRN
  Administered 2015-03-28: 1 via TRANSDERMAL

## 2015-03-28 MED ORDER — 0.9 % SODIUM CHLORIDE (POUR BTL) OPTIME
TOPICAL | Status: DC | PRN
  Administered 2015-03-28: 1000 mL

## 2015-03-28 MED ORDER — DEXAMETHASONE SODIUM PHOSPHATE 4 MG/ML IJ SOLN
INTRAMUSCULAR | Status: DC | PRN
  Administered 2015-03-28: 8 mg via INTRAVENOUS

## 2015-03-28 MED ORDER — OXYCODONE HCL 5 MG PO TABS: 5.0000 mg | ORAL_TABLET | Freq: Once | ORAL | Status: DC | PRN

## 2015-03-28 MED ORDER — DEXAMETHASONE SODIUM PHOSPHATE 4 MG/ML IJ SOLN
INTRAMUSCULAR | Status: AC
  Filled 2015-03-28: qty 2

## 2015-03-28 MED ORDER — SODIUM CHLORIDE 0.9 % IV SOLN
INTRAVENOUS | Status: DC | PRN
  Administered 2015-03-28: 15 mL

## 2015-03-28 MED ORDER — SUCCINYLCHOLINE CHLORIDE 20 MG/ML IJ SOLN
INTRAMUSCULAR | Status: AC
  Filled 2015-03-28: qty 1

## 2015-03-28 MED ORDER — ROCURONIUM BROMIDE 100 MG/10ML IV SOLN
INTRAVENOUS | Status: DC | PRN
  Administered 2015-03-28: 5 mg via INTRAVENOUS
  Administered 2015-03-28: 40 mg via INTRAVENOUS
  Administered 2015-03-28: 5 mg via INTRAVENOUS

## 2015-03-28 MED ORDER — OXYCODONE HCL 5 MG/5ML PO SOLN: 5.0000 mg | Freq: Once | ORAL | Status: DC | PRN

## 2015-03-28 MED ORDER — PHENYLEPHRINE 40 MCG/ML (10ML) SYRINGE FOR IV PUSH (FOR BLOOD PRESSURE SUPPORT)
PREFILLED_SYRINGE | INTRAVENOUS | Status: AC
  Filled 2015-03-28: qty 10

## 2015-03-28 MED ORDER — MIDAZOLAM HCL 5 MG/5ML IJ SOLN
INTRAMUSCULAR | Status: DC | PRN
  Administered 2015-03-28: 2 mg via INTRAVENOUS

## 2015-03-28 MED ORDER — LIDOCAINE HCL (CARDIAC) 20 MG/ML IV SOLN
INTRAVENOUS | Status: AC
  Filled 2015-03-28: qty 5

## 2015-03-28 SURGICAL SUPPLY — 57 items
APL SKNCLS STERI-STRIP NONHPOA (GAUZE/BANDAGES/DRESSINGS) ×1
APPLIER CLIP ROT 10 11.4 M/L (STAPLE) ×3
APR CLP MED LRG 11.4X10 (STAPLE) ×1
BAG SPEC RTRVL LRG 6X4 10 (ENDOMECHANICALS) ×1
BENZOIN TINCTURE PRP APPL 2/3 (GAUZE/BANDAGES/DRESSINGS) ×3 IMPLANT
BLADE SURG ROTATE 9660 (MISCELLANEOUS) IMPLANT
CANISTER SUCTION 2500CC (MISCELLANEOUS) ×3 IMPLANT
CHLORAPREP W/TINT 26ML (MISCELLANEOUS) ×3 IMPLANT
CLIP APPLIE ROT 10 11.4 M/L (STAPLE) ×1 IMPLANT
COVER MAYO STAND STRL (DRAPES) ×3 IMPLANT
COVER SURGICAL LIGHT HANDLE (MISCELLANEOUS) ×3 IMPLANT
DRAPE C-ARM 42X72 X-RAY (DRAPES) ×3 IMPLANT
DRSG TEGADERM 2-3/8X2-3/4 SM (GAUZE/BANDAGES/DRESSINGS) ×9 IMPLANT
DRSG TEGADERM 4X4.75 (GAUZE/BANDAGES/DRESSINGS) ×3 IMPLANT
ELECT REM PT RETURN 9FT ADLT (ELECTROSURGICAL) ×3
ELECTRODE REM PT RTRN 9FT ADLT (ELECTROSURGICAL) ×1 IMPLANT
FILTER SMOKE EVAC LAPAROSHD (FILTER) ×3 IMPLANT
GAUZE SPONGE 2X2 8PLY STRL LF (GAUZE/BANDAGES/DRESSINGS) ×1 IMPLANT
GLOVE BIO SURGEON STRL SZ 6.5 (GLOVE) ×2 IMPLANT
GLOVE BIO SURGEON STRL SZ7 (GLOVE) ×5 IMPLANT
GLOVE BIO SURGEON STRL SZ7.5 (GLOVE) ×3 IMPLANT
GLOVE BIO SURGEON STRL SZ8 (GLOVE) ×3 IMPLANT
GLOVE BIO SURGEONS STRL SZ 6.5 (GLOVE) ×1
GLOVE BIOGEL PI IND STRL 6.5 (GLOVE) ×3 IMPLANT
GLOVE BIOGEL PI IND STRL 7.0 (GLOVE) ×1 IMPLANT
GLOVE BIOGEL PI IND STRL 7.5 (GLOVE) ×1 IMPLANT
GLOVE BIOGEL PI IND STRL 8 (GLOVE) ×1 IMPLANT
GLOVE BIOGEL PI INDICATOR 6.5 (GLOVE) ×6
GLOVE BIOGEL PI INDICATOR 7.0 (GLOVE) ×2
GLOVE BIOGEL PI INDICATOR 7.5 (GLOVE) ×4
GLOVE BIOGEL PI INDICATOR 8 (GLOVE) ×2
GOWN STRL REUS W/ TWL LRG LVL3 (GOWN DISPOSABLE) ×4 IMPLANT
GOWN STRL REUS W/ TWL XL LVL3 (GOWN DISPOSABLE) ×1 IMPLANT
GOWN STRL REUS W/TWL LRG LVL3 (GOWN DISPOSABLE) ×12
GOWN STRL REUS W/TWL XL LVL3 (GOWN DISPOSABLE) ×3
HEMOSTAT SNOW SURGICEL 2X4 (HEMOSTASIS) ×3 IMPLANT
KIT BASIN OR (CUSTOM PROCEDURE TRAY) ×3 IMPLANT
KIT ROOM TURNOVER OR (KITS) ×3 IMPLANT
NS IRRIG 1000ML POUR BTL (IV SOLUTION) ×3 IMPLANT
PAD ARMBOARD 7.5X6 YLW CONV (MISCELLANEOUS) ×3 IMPLANT
PENCIL BUTTON HOLSTER BLD 10FT (ELECTRODE) ×3 IMPLANT
POUCH SPECIMEN RETRIEVAL 10MM (ENDOMECHANICALS) ×3 IMPLANT
SCISSORS LAP 5X35 DISP (ENDOMECHANICALS) ×3 IMPLANT
SET CHOLANGIOGRAPH 5 50 .035 (SET/KITS/TRAYS/PACK) ×3 IMPLANT
SET IRRIG TUBING LAPAROSCOPIC (IRRIGATION / IRRIGATOR) ×3 IMPLANT
SLEEVE ENDOPATH XCEL 5M (ENDOMECHANICALS) ×3 IMPLANT
SPECIMEN JAR MEDIUM (MISCELLANEOUS) ×3 IMPLANT
SPECIMEN JAR SMALL (MISCELLANEOUS) IMPLANT
SPONGE GAUZE 2X2 STER 10/PKG (GAUZE/BANDAGES/DRESSINGS) ×2
SUT MNCRL AB 4-0 PS2 18 (SUTURE) ×3 IMPLANT
TOWEL OR 17X24 6PK STRL BLUE (TOWEL DISPOSABLE) ×3 IMPLANT
TOWEL OR 17X26 10 PK STRL BLUE (TOWEL DISPOSABLE) IMPLANT
TRAY LAPAROSCOPIC (CUSTOM PROCEDURE TRAY) ×3 IMPLANT
TROCAR XCEL BLUNT TIP 100MML (ENDOMECHANICALS) ×3 IMPLANT
TROCAR XCEL NON-BLD 11X100MML (ENDOMECHANICALS) ×3 IMPLANT
TROCAR XCEL NON-BLD 5MMX100MML (ENDOMECHANICALS) ×3 IMPLANT
TUBING INSUFFLATION (TUBING) ×3 IMPLANT

## 2015-03-28 NOTE — Op Note (Signed)
Laparoscopic Cholecystectomy with IOC Procedure Note  Indications: This patient presents with symptomatic gallbladder disease and will undergo laparoscopic cholecystectomy.  Pre-operative Diagnosis: Calculus of gallbladder with acute cholecystitis, without mention of obstruction  Post-operative Diagnosis: Same  Surgeon: Iretha Kirley K.   Assistants: Dr. Violeta GelinasBurke Thompson, MD  Anesthesia: General endotracheal anesthesia  ASA Class: 1E  Procedure Details  The patient was seen again in the Holding Room. The risks, benefits, complications, treatment options, and expected outcomes were discussed with the patient. The possibilities of reaction to medication, pulmonary aspiration, perforation of viscus, bleeding, recurrent infection, finding a normal gallbladder, the need for additional procedures, failure to diagnose a condition, the possible need to convert to an open procedure, and creating a complication requiring transfusion or operation were discussed with the patient. The likelihood of improving the patient's symptoms with return to their baseline status is good.  The patient and/or family concurred with the proposed plan, giving informed consent. The site of surgery properly noted. The patient was taken to Operating Room, identified as Karen Taylor and the procedure verified as Laparoscopic Cholecystectomy with Intraoperative Cholangiogram. A Time Out was held and the above information confirmed.  Prior to the induction of general anesthesia, antibiotic prophylaxis was administered. General endotracheal anesthesia was then administered and tolerated well. After the induction, the abdomen was prepped with Chloraprep and draped in the sterile fashion. The patient was positioned in the supine position.  Local anesthetic agent was injected into the skin above the umbilicus and an incision made. We dissected down to the abdominal fascia with blunt dissection.  The fascia was incised vertically and we  entered the peritoneal cavity bluntly.  A pursestring suture of 0-Vicryl was placed around the fascial opening.  The Hasson cannula was inserted and secured with the stay suture.  Pneumoperitoneum was then created with CO2 and tolerated well without any adverse changes in the patient's vital signs. An 11-mm port was placed in the subxiphoid position.  Two 5-mm ports were placed in the right upper quadrant. All skin incisions were infiltrated with a local anesthetic agent before making the incision and placing the trocars.   We positioned the patient in reverse Trendelenburg, tilted slightly to the patient's left.  The gallbladder was identified and it was very erythematous and edematous.  The gallbladder was decompressed with the suction aspirator.  The fundus was grasped and retracted cephalad. Adhesions were lysed bluntly and with the electrocautery where indicated, taking care not to injure any adjacent organs or viscus. The infundibulum was grasped and retracted laterally, exposing the peritoneum overlying the triangle of Calot. This was then divided and exposed in a blunt fashion. A critical view of the cystic duct and cystic artery was obtained.  The cystic duct was clearly identified and bluntly dissected circumferentially. The cystic duct was ligated with a clip distally.   An incision was made in the cystic duct and the [facility] introduced. The catheter was secured using a clip. A cholangiogram was then obtained which showed good visualization of the distal and proximal biliary tree with no sign of filling defects or obstruction.  Contrast flowed easily into the duodenum. The catheter was then removed.   The cystic duct was then ligated with clips and divided. The cystic artery was identified, dissected free, ligated with clips and divided as well.   The gallbladder was dissected from the liver bed in retrograde fashion with the electrocautery. The gallbladder was removed and placed  in an Endocatch sac. The liver  bed was irrigated and inspected. Hemostasis was achieved with the electrocautery and Surgicel SNOW. Copious irrigation was utilized and was repeatedly aspirated until clear.  The gallbladder and Endocatch sac were then removed through the umbilical port site.  The pursestring suture was used to close the umbilical fascia.    We again inspected the right upper quadrant for hemostasis.  Pneumoperitoneum was released as we removed the trocars.  4-0 Monocryl was used to close the skin.   Benzoin, steri-strips, and clean dressings were applied. The patient was then extubated and brought to the recovery room in stable condition. Instrument, sponge, and needle counts were correct at closure and at the conclusion of the case.   Findings: Cholecystitis with Cholelithiasis  Estimated Blood Loss: less than 100 mL         Drains: none         Specimens: Gallbladder           Complications: None; patient tolerated the procedure well.         Disposition: PACU - hemodynamically stable.         Condition: stable   Wilmon Arms. Corliss Skains, MD, [facility]  General/ Trauma Surgery  03/28/2015 11:38 AM

## 2015-03-28 NOTE — Anesthesia Postprocedure Evaluation (Signed)
  Anesthesia Post-op Note  Patient: Karen Taylor  Procedure(s) Performed: Procedure(s): LAPAROSCOPIC CHOLECYSTECTOMY WITH INTRAOPERATIVE CHOLANGIOGRAM (N/A)  Patient Location: PACU  Anesthesia Type:General  Level of Consciousness: awake, alert  and oriented  Airway and Oxygen Therapy: Patient Spontanous Breathing and Patient connected to nasal cannula oxygen  Post-op Pain: mild  Post-op Assessment: Post-op Vital signs reviewed, Patient's Cardiovascular Status Stable, Respiratory Function Stable, Patent Airway and Pain level controlled              Post-op Vital Signs: stable  Last Vitals:  Filed Vitals:   03/28/15 1309  BP: 125/73  Pulse: 104  Temp: 37.3 C  Resp: 16    Complications: No apparent anesthesia complications

## 2015-03-28 NOTE — Anesthesia Procedure Notes (Signed)
Procedure Name: Intubation Performed by: Everlene Balls T Pre-anesthesia Checklist: Patient identified, Emergency Drugs available, Suction available, Patient being monitored and Timeout performed Oxygen Delivery Method: Circle system utilized Intubation Type: IV induction, Rapid sequence and Cricoid Pressure applied Laryngoscope Size: Mac and 3 Grade View: Grade I Tube type: Oral Tube size: 7.0 mm Number of attempts: 1 Airway Equipment and Method: Stylet Placement Confirmation: ETT inserted through vocal cords under direct vision,  breath sounds checked- equal and bilateral and positive ETCO2 Secured at: 22 cm Tube secured with: Tape Dental Injury: Teeth and Oropharynx as per pre-operative assessment

## 2015-03-28 NOTE — Transfer of Care (Signed)
Immediate Anesthesia Transfer of Care Note  Patient: Karen Taylor  Procedure(s) Performed: Procedure(s): LAPAROSCOPIC CHOLECYSTECTOMY WITH INTRAOPERATIVE CHOLANGIOGRAM (N/A)  Patient Location: PACU  Anesthesia Type:General  Level of Consciousness: awake, alert , oriented, patient cooperative and responds to stimulation  Airway & Oxygen Therapy: Patient Spontanous Breathing and Patient connected to face mask oxygen  Post-op Assessment: Report given to RN and Post -op Vital signs reviewed and stable  Post vital signs: Reviewed and stable  Last Vitals:  Filed Vitals:   03/28/15 0900  BP: 118/62  Pulse: 119  Temp: 37.2 C  Resp: 16    Complications: No apparent anesthesia complications

## 2015-03-28 NOTE — Anesthesia Preprocedure Evaluation (Addendum)
Anesthesia Evaluation  Patient identified by MRN, date of birth, ID band Patient awake    History of Anesthesia Complications (+) PONV  Airway Mallampati: I  TM Distance: >3 FB     Dental  (+) Teeth Intact, Dental Advisory Given   Pulmonary asthma ,  breath sounds clear to auscultation        Cardiovascular negative cardio ROS  Rhythm:Regular Rate:Normal     Neuro/Psych Anxiety    GI/Hepatic Neg liver ROS, GERD-  Medicated and Controlled,  Endo/Other  negative endocrine ROS  Renal/GU negative Renal ROS     Musculoskeletal   Abdominal (+) + obese,   Peds  Hematology negative hematology ROS (+)   Anesthesia Other Findings   Reproductive/Obstetrics                             Anesthesia Physical Anesthesia Plan  ASA: II  Anesthesia Plan: General   Post-op Pain Management:    Induction: Intravenous  Airway Management Planned: Oral ETT  Additional Equipment:   Intra-op Plan:   Post-operative Plan: Extubation in OR  Informed Consent: I have reviewed the patients History and Physical, chart, labs and discussed the procedure including the risks, benefits and alternatives for the proposed anesthesia with the patient or authorized representative who has indicated his/her understanding and acceptance.   Dental advisory given  Plan Discussed with: Anesthesiologist, Surgeon and CRNA  Anesthesia Plan Comments:        Anesthesia Quick Evaluation

## 2015-03-28 NOTE — Progress Notes (Signed)
Subjective: Feeling a little better, but requiring some pain meds No more nausea  Objective: Vital signs in last 24 hours: Temp:  [98.3 F (36.8 C)-100.6 F (38.1 C)] 98.9 F (37.2 C) (06/27 0655) Pulse Rate:  [77-133] 133 (06/27 0655) Resp:  [16-20] 16 (06/27 0655) BP: (127-157)/(62-95) 127/62 mmHg (06/27 0655) SpO2:  [91 %-100 %] 91 % (06/27 0655) Weight:  [123.378 kg (272 lb)] 123.378 kg (272 lb) (06/26 0742) Last BM Date: 03/27/15  Intake/Output from previous day: 06/26 0701 - 06/27 0700 In: 2080 [P.O.:480; IV Piggyback:1600] Out: -  Intake/Output this shift:    GI: Tender REUQ  Lab Results:   Recent Labs  03/27/15 1424 03/28/15 0505  WBC 13.7* 15.3*  HGB 13.5 12.6  HCT 41.1 38.9  PLT 420* 397   BMET  Recent Labs  03/27/15 0752 03/27/15 1424 03/28/15 0505  NA 138  --  134*  K 3.8  --  4.7  CL 104  --  101  CO2 25  --  27  GLUCOSE 100*  --  110*  BUN 9  --  <5*  CREATININE 0.76 0.70 0.81  CALCIUM 9.1  --  8.7*   PT/INR No results for input(s): LABPROT, INR in the last 72 hours. ABG No results for input(s): PHART, HCO3 in the last 72 hours.  Invalid input(s): PCO2, PO2  Studies/Results: US Abdomen Complete  03/27/2015   CLINICAL DATA:  Right upper quadrant pain  EXAM: ULTRASOUND ABDOMEN COMPLETE  COMPARISON:  None.  FINDINGS: Gallbladder: Stone within the neck of gallbladder measures 1.6 cm. Gallbladder wall is upper limits of normal in thickness measuring 3 mm. No sonographic Murphy sign noted.  Common bile duct: Diameter: 3 mm.  Liver: No focal lesion identified. Within normal limits in parenchymal echogenicity.  IVC: No abnormality visualized.  Pancreas: Visualized portion unremarkable.  Spleen: Size and appearance within normal limits.  Right Kidney: Length: 11.2 cm. Mild pelviectasis. Echogenicity within normal limits. No mass or hydronephrosis visualized.  Left Kidney: Length: 12.2 cm. Echogenicity within normal limits. No mass or  hydronephrosis visualized.  Abdominal aorta: No aneurysm visualized.  Other findings: None.  IMPRESSION: 1. Gallstone noted within the gallbladder neck. The gallbladder wall is upper limits of normal in thickness measuring 3 mm. Correlate for any clinical signs or symptoms of cholecystitis. 2. Gallstones.   Electronically Signed   By: Signa Kell M.D.   On: 03/27/2015 12:03   Ct Abdomen Pelvis W Contrast  03/27/2015   CLINICAL DATA:  Right lower quadrant abdominal pain.  Vomiting.  EXAM: CT ABDOMEN AND PELVIS WITH CONTRAST  TECHNIQUE: Multidetector CT imaging of the abdomen and pelvis was performed using the standard protocol following bolus administration of intravenous contrast.  CONTRAST:  OMNIPAQUE IOHEXOL 300 MG/ML  SOLN  COMPARISON:  None.  FINDINGS: Normal hepatic contour. No discrete hepatic lesions. Normal appearance of the gallbladder. No radiopaque gallstones. No intra extrahepatic biliary duct dilatation. No ascites.  There is symmetric enhancement of the bilateral kidneys. No renal stones on this postcontrast examination. No discrete renal lesions. No urinary obstruction or perinephric stranding. Normal appearance of the bilateral adrenal glands, pancreas and spleen. Incidental note is made of several small splenules.  The bowel is normal in course and caliber without wall thickening or evidence of obstruction. Normal appearance appendix. No pneumoperitoneum, pneumatosis or portal venous gas.  Normal caliber the abdominal aorta. The major branch vessels of the abdominal aorta appear patent on this non CTA examination.  Scattered retroperitoneal  lymph nodes individually not enlarged by size criteria. No bulky retroperitoneal, mesenteric, pelvic or inguinal lymphadenopathy.  Note is made of a approximately 1.3 x 1.6 cm left-sided presumably physiologic adnexal cyst (image 85, series 201). No discrete right-sided adnexal lesions. No free fluid the pelvic cul-de-sac. Several phleboliths are seen  the lower pelvis bilaterally. Normal appearance of the urinary bladder given degree distention.  Limited visualization the lower thorax demonstrates minimal dependent ground-glass atelectasis. No discrete focal airspace opacities. No pleural effusion.  Normal heart size.  No pericardial effusion.  No acute or aggressive osseous abnormalities. Tiny mesenteric fat containing periumbilical hernia.  IMPRESSION: 1. No explanation for patient's right lower quadrant abdominal pain. Specifically, no evidence of enteric or urinary obstruction. Normal appearance of the appendix. 2. Incidentally noted approximately 1.6 cm left-sided presumably physiologic adnexal cyst.   Electronically Signed   By: Simonne ComeJohn  Watts M.D.   On: 03/27/2015 10:22    Anti-infectives: Anti-infectives    Start     Dose/Rate Route Frequency Ordered Stop   03/27/15 1415  ciprofloxacin (CIPRO) IVPB 400 mg     400 mg 200 mL/hr over 60 Minutes Intravenous Every 12 hours 03/27/15 1410        Assessment/Plan: s/p Procedure(s): LAPAROSCOPIC CHOLECYSTECTOMY WITH POSSIBLE INTRAOPERATIVE CHOLANGIOGRAM (N/A) Plan surgery this morning.    The surgical procedure has been discussed with the patient.  Potential risks, benefits, alternative treatments, and expected outcomes have been explained.  All of the patient's questions at this time have been answered.  The likelihood of reaching the patient's treatment goal is good.  The patient understand the proposed surgical procedure and wishes to proceed.      Caprice Mccaffrey K. 03/28/2015

## 2015-03-29 ENCOUNTER — Encounter (HOSPITAL_COMMUNITY): Payer: Self-pay | Admitting: Surgery

## 2015-03-29 LAB — CBC
HCT: 36.3 % (ref 36.0–46.0)
Hemoglobin: 11.6 g/dL — ABNORMAL LOW (ref 12.0–15.0)
MCH: 28.7 pg (ref 26.0–34.0)
MCHC: 32 g/dL (ref 30.0–36.0)
MCV: 89.9 fL (ref 78.0–100.0)
Platelets: 369 10*3/uL (ref 150–400)
RBC: 4.04 MIL/uL (ref 3.87–5.11)
RDW: 15.6 % — ABNORMAL HIGH (ref 11.5–15.5)
WBC: 15.9 10*3/uL — AB (ref 4.0–10.5)

## 2015-03-29 LAB — COMPREHENSIVE METABOLIC PANEL
ALBUMIN: 3 g/dL — AB (ref 3.5–5.0)
ALT: 49 U/L (ref 14–54)
ANION GAP: 4 — AB (ref 5–15)
AST: 54 U/L — AB (ref 15–41)
Alkaline Phosphatase: 62 U/L (ref 38–126)
BUN: <5 mg/dL — ABNORMAL LOW (ref 6–20)
CHLORIDE: 104 mmol/L (ref 101–111)
CO2: 28 mmol/L (ref 22–32)
CREATININE: 0.71 mg/dL (ref 0.44–1.00)
Calcium: 8.7 mg/dL — ABNORMAL LOW (ref 8.9–10.3)
Glucose, Bld: 125 mg/dL — ABNORMAL HIGH (ref 65–99)
POTASSIUM: 4.9 mmol/L (ref 3.5–5.1)
Sodium: 136 mmol/L (ref 135–145)
Total Bilirubin: 0.3 mg/dL (ref 0.3–1.2)
Total Protein: 6.6 g/dL (ref 6.5–8.1)

## 2015-03-29 MED ORDER — OXYCODONE HCL 5 MG PO TABS: 5.0000 mg | ORAL_TABLET | Freq: Four times a day (QID) | ORAL | Status: AC | PRN

## 2015-03-29 NOTE — Progress Notes (Signed)
Raiford NobleErin E Rogness to be D/C'd Home per MD order.  Discussed with the patient and all questions fully answered.  VSS, Skin clean, dry and intact surgical dressing have no shadowing and can be removed tomorrow, no evidence of skin tears noted. IV catheter discontinued intact. Site without signs and symptoms of complications. Dressing and pressure applied.  An After Visit Summary was printed and given to the patient. Patient received prescription.  D/c education completed with patient/family including follow up instructions, medication list, d/c activities limitations if indicated, with other d/c instructions as indicated by MD - patient able to verbalize understanding, all questions fully answered.   Patient instructed to return to ED, call 911, or call MD for any changes in condition.   Patient escorted via WC, and D/C home via private auto.  Sherene Siresillman, Bea Duren Jo 03/29/2015 1:18 PM

## 2015-03-29 NOTE — Discharge Summary (Signed)
[facility] Discharge Summary   Patient ID: Karen Taylor MRN: [id number] DOB/AGE: Nov 08, 1985 29 y.o.  Admit date: 03/27/2015 Discharge date: 03/29/2015  Admitting Diagnosis: Acute cholecystitis Morbid obesity - Body mass index is 41.37 kg/(m^2).   Discharge Diagnosis Patient Active Problem List   Diagnosis Date Noted  . Acute cholecystitis 03/27/2015  . ADD (attention deficit disorder) 01/01/2011  . Allergic rhinitis, seasonal 01/01/2011  . Anxiety 01/01/2011  . Headache, tension-type 01/01/2011  . Asthma 01/01/2011    Consultants None  Imaging: Dg Cholangiogram Operative  03/28/2015   CLINICAL DATA:  29 year old female with acute cholecystitis  EXAM: INTRAOPERATIVE CHOLANGIOGRAM  TECHNIQUE: Cholangiographic images from the C-arm fluoroscopic device were submitted for interpretation post-operatively. Please see the procedural report for the amount of contrast and the fluoroscopy time utilized.  COMPARISON:  CT abdomen/ pelvis and abdominal ultrasound 03/27/2015  FINDINGS: Cine run obtained during intraoperative cholangiogram at the time of laparoscopic cholecystectomy. The images demonstrate cannulation of the cystic duct remnant with opacification of the biliary tree. No evidence of biliary ductal dilatation, stenosis, stricture or filling defect. Contrast material passes through the ampulla and into the duodenum.  IMPRESSION: Negative intraoperative cholangiogram.   Electronically Signed   By: Malachy Moan M.D.   On: 03/28/2015 11:47   US Abdomen Complete  03/27/2015   CLINICAL DATA:  Right upper quadrant pain  EXAM: ULTRASOUND ABDOMEN COMPLETE  COMPARISON:  None.  FINDINGS: Gallbladder: Stone within the neck of gallbladder measures 1.6 cm. Gallbladder wall is upper limits of normal in thickness measuring 3 mm. No sonographic Murphy sign noted.  Common bile duct: Diameter: 3 mm.  Liver: No focal lesion identified. Within normal limits in parenchymal  echogenicity.  IVC: No abnormality visualized.  Pancreas: Visualized portion unremarkable.  Spleen: Size and appearance within normal limits.  Right Kidney: Length: 11.2 cm. Mild pelviectasis. Echogenicity within normal limits. No mass or hydronephrosis visualized.  Left Kidney: Length: 12.2 cm. Echogenicity within normal limits. No mass or hydronephrosis visualized.  Abdominal aorta: No aneurysm visualized.  Other findings: None.  IMPRESSION: 1. Gallstone noted within the gallbladder neck. The gallbladder wall is upper limits of normal in thickness measuring 3 mm. Correlate for any clinical signs or symptoms of cholecystitis. 2. Gallstones.   Electronically Signed   By: Signa Kell M.D.   On: 03/27/2015 12:03   Ct Abdomen Pelvis W Contrast  03/27/2015   CLINICAL DATA:  Right lower quadrant abdominal pain.  Vomiting.  EXAM: CT ABDOMEN AND PELVIS WITH CONTRAST  TECHNIQUE: Multidetector CT imaging of the abdomen and pelvis was performed using the standard protocol following bolus administration of intravenous contrast.  CONTRAST:  OMNIPAQUE IOHEXOL 300 MG/ML  SOLN  COMPARISON:  None.  FINDINGS: Normal hepatic contour. No discrete hepatic lesions. Normal appearance of the gallbladder. No radiopaque gallstones. No intra extrahepatic biliary duct dilatation. No ascites.  There is symmetric enhancement of the bilateral kidneys. No renal stones on this postcontrast examination. No discrete renal lesions. No urinary obstruction or perinephric stranding. Normal appearance of the bilateral adrenal glands, pancreas and spleen. Incidental note is made of several small splenules.  The bowel is normal in course and caliber without wall thickening or evidence of obstruction. Normal appearance appendix. No pneumoperitoneum, pneumatosis or portal venous gas.  Normal caliber the abdominal aorta. The major branch vessels of the abdominal aorta appear patent on this non CTA examination.  Scattered retroperitoneal lymph  nodes individually not enlarged by size criteria. No bulky retroperitoneal, mesenteric, pelvic  or inguinal lymphadenopathy.  Note is made of a approximately 1.3 x 1.6 cm left-sided presumably physiologic adnexal cyst (image 85, series 201). No discrete right-sided adnexal lesions. No free fluid the pelvic cul-de-sac. Several phleboliths are seen the lower pelvis bilaterally. Normal appearance of the urinary bladder given degree distention.  Limited visualization the lower thorax demonstrates minimal dependent ground-glass atelectasis. No discrete focal airspace opacities. No pleural effusion.  Normal heart size.  No pericardial effusion.  No acute or aggressive osseous abnormalities. Tiny mesenteric fat containing periumbilical hernia.  IMPRESSION: 1. No explanation for patient's right lower quadrant abdominal pain. Specifically, no evidence of enteric or urinary obstruction. Normal appearance of the appendix. 2. Incidentally noted approximately 1.6 cm left-sided presumably physiologic adnexal cyst.   Electronically Signed   By: Simonne ComeJohn  Watts M.D.   On: 03/27/2015 10:22    Procedures Dr. Corliss Skainssuei (03/28/15) - [facility]   Hospital Course:  29 y.o. Female.who [facility] abdominal pain since 4 am.  Found to have gallstones on US with borderline GB wall thickening. Pain sharp severe in RUQ made better with pain meds but returns when meds Wear off. Had [place] steak sandwich at dinner. First time this has happened.   Workup showed acute cholecystitis.  Patient was admitted and underwent procedure listed above on HD #2.  Tolerated procedure well and was transferred to the floor.  Diet was advanced as tolerated.  On POD #1, the patient was voiding well, tolerating diet, ambulating well, pain well controlled, vital signs stable, incisions c/d/i and felt stable for discharge home.  Patient will follow up in our office in 3 weeks and knows to call with questions or  concerns.  Physical Exam: General:  Alert, NAD, pleasant, comfortable Abd:  Obese, soft, ND, mild tenderness, incisions C/D/I dermabond in place    Medication List    TAKE these medications        albuterol 108 (90 BASE) MCG/ACT inhaler  Commonly known as:  PROAIR HFA  Inhale 2 puffs into the lungs as needed.     ibuprofen 200 MG tablet  Commonly known as:  ADVIL,MOTRIN  Take 400 mg by mouth every 6 (six) hours as needed for mild pain or moderate pain.     oxyCODONE 5 MG immediate release tablet  Commonly known as:  Oxy IR/ROXICODONE  Take 1-2 tablets (5-10 mg total) by mouth every 6 (six) hours as needed for moderate pain.             Follow-up Information    Follow up with [facility] [place]. Go on 04/19/2015.   Why:  For post-operation check. Your appointment is at 2:30pm, please arrive at least 30 min before your appointment to complete your check in paperwork.  If you are unable to arrive 30 min prior to your appointment time we may have to cancel or reschedule you   Contact information:   [address] [place] [id number] [phone]      Signed: Nonie HoyerMegan N. Rhilee Currin, [facility] [phone]  03/29/2015, 7:48 AM

## 2015-03-29 NOTE — Discharge Instructions (Signed)
Your appointment is at 2:30pm, please arrive at least 30 min before your appointment to complete your check in paperwork.  If you are unable to arrive 30 min prior to your appointment time we may have to cancel or reschedule you.  LAPAROSCOPIC SURGERY: POST OP INSTRUCTIONS  1. DIET: Follow a light bland diet the first 24 hours after arrival home, such as soup, liquids, crackers, etc. Be sure to include lots of fluids daily. Avoid fast food or heavy meals as your are more likely to get nauseated. Eat a low fat the next few days after surgery.  2. Take your usually prescribed home medications unless otherwise directed. 3. PAIN CONTROL:  1. Pain is best controlled by a usual combination of three different methods TOGETHER:  1. Ice/Heat 2. Over the counter pain medication 3. Prescription pain medication 2. Most patients will experience some swelling and bruising around the incisions. Ice packs or heating pads (30-60 minutes up to 6 times a day) will help. Use ice for the first few days to help decrease swelling and bruising, then switch to heat to help relax tight/sore spots and speed recovery. Some people prefer to use ice alone, heat alone, alternating between ice & heat. Experiment to what works for you. Swelling and bruising can take several weeks to resolve.  3. It is helpful to take an over-the-counter pain medication regularly for the first few weeks. Choose one of the following that works best for you:  1. Naproxen (Aleve, etc) Two 220mg  tabs twice a day 2. Ibuprofen (Advil, etc) Three 200mg  tabs four times a day (every meal & bedtime) 3. Acetaminophen (Tylenol, etc) 500-650mg  four times a day (every meal & bedtime) 4. A prescription for pain medication (such as oxycodone, hydrocodone, etc) should be given to you upon discharge. Take your pain medication as prescribed.  1. If you are having problems/concerns with the prescription medicine (does not control pain, nausea, vomiting, rash, itching,  etc), please call us [phone] to see if we need to switch you to a different pain medicine that will work better for you and/or control your side effect better. 2. If you need a refill on your pain medication, please contact your pharmacy. They will contact our office to request authorization. Prescriptions will not be filled after 5 pm or on week-ends. 4. Avoid getting constipated. Between the surgery and the pain medications, it is common to experience some constipation. Increasing fluid intake and taking a fiber supplement (such as Metamucil, Citrucel, FiberCon, MiraLax, etc) 1-2 times a day regularly will usually help prevent this problem from occurring. A mild laxative (prune juice, Milk of Magnesia, MiraLax, etc) should be taken according to package directions if there are no bowel movements after 48 hours.  5. Watch out for diarrhea. If you have many loose bowel movements, simplify your diet to bland foods & liquids for a few days. Stop any stool softeners and decrease your fiber supplement. Switching to mild anti-diarrheal medications (Kayopectate, Pepto Bismol) can help. If this worsens or does not improve, please call us. 6. Wash / shower every day. You may shower over the dressings as they are waterproof. Continue to shower over incision(s) after the dressing is off. 7. Remove your waterproof bandages 5 days after surgery. You may leave the incision open to air. You may replace a dressing/Band-Aid to cover the incision for comfort if you wish.  8. ACTIVITIES as tolerated:  1. You may resume regular (light) daily activities beginning the next day--such as  walking, climbing stairs--gradually increasing activities as tolerated. If you can walk 30 minutes without difficulty, it is safe to try more intense activity such as jogging, treadmill, bicycling, low-impact aerobics, swimming, etc. °2. Save the most intensive and strenuous activity for last such as sit-ups, heavy lifting,  contact sports, etc Refrain from any heavy lifting or straining until you are off narcotics for pain control.  °3. DO NOT PUSH THROUGH PAIN. Let pain be your guide: If it hurts to do something, don't do it. Pain is your body warning you to avoid that activity for another week until the pain goes down. °4. You may drive when you are no longer taking prescription pain medication, you can comfortably wear a seatbelt, and you can safely maneuver your car and apply brakes. °5. You may have sexual intercourse when it is comfortable.  °9. FOLLOW UP in our office  °1. Please call [facility] at [phone] to set up an appointment to see your surgeon in the office for a follow-up appointment approximately 2-3 weeks after your surgery. °2. Make sure that you call for this appointment the day you arrive home to insure a convenient appointment time. °     10. IF YOU HAVE DISABILITY OR FAMILY LEAVE FORMS, BRING THEM TO THE               OFFICE FOR PROCESSING.  ° °WHEN TO CALL US [phone]:  °1. Poor pain control °2. Reactions / problems with new medications (rash/itching, nausea, etc)  °3. Fever over 101.5 F (38.5 C) °4. Inability to urinate °5. Nausea and/or vomiting °6. Worsening swelling or bruising °7. Continued bleeding from incision. °8. Increased pain, redness, or drainage from the incision ° °The clinic staff is available to answer your questions during regular business hours (8:30am-5pm). Please don’t hesitate to call and ask to speak to one of our nurses for clinical concerns.  °If you have a medical emergency, go to the nearest emergency room or call 911.  °A surgeon from [facility] is always on call at the hospitals  ° °[facility]  °[address], [address], [place], [place] [address] ?  °MAIN: [phone] ? TOLL FREE: [phone] ?  °FAX [phone]  °[email or web address] ° °

## 2016-02-10 IMAGING — RF DG CHOLANGIOGRAM OPERATIVE
1 series · 4 of 4 positions shown · non-contrast
Comparison: CT abdomen/ pelvis and abdominal ultrasound 03/27/2015

CLINICAL DATA: 28-year-old female with acute cholecystitis

EXAM:
INTRAOPERATIVE CHOLANGIOGRAM
TECHNIQUE: Cholangiographic images from the C-arm fluoroscopic device were
submitted for interpretation post-operatively. Please see the
procedural report for the amount of contrast and the fluoroscopy
time utilized.

[Series 1: run · 4 of 82 frames shown]
[frame 13/82]
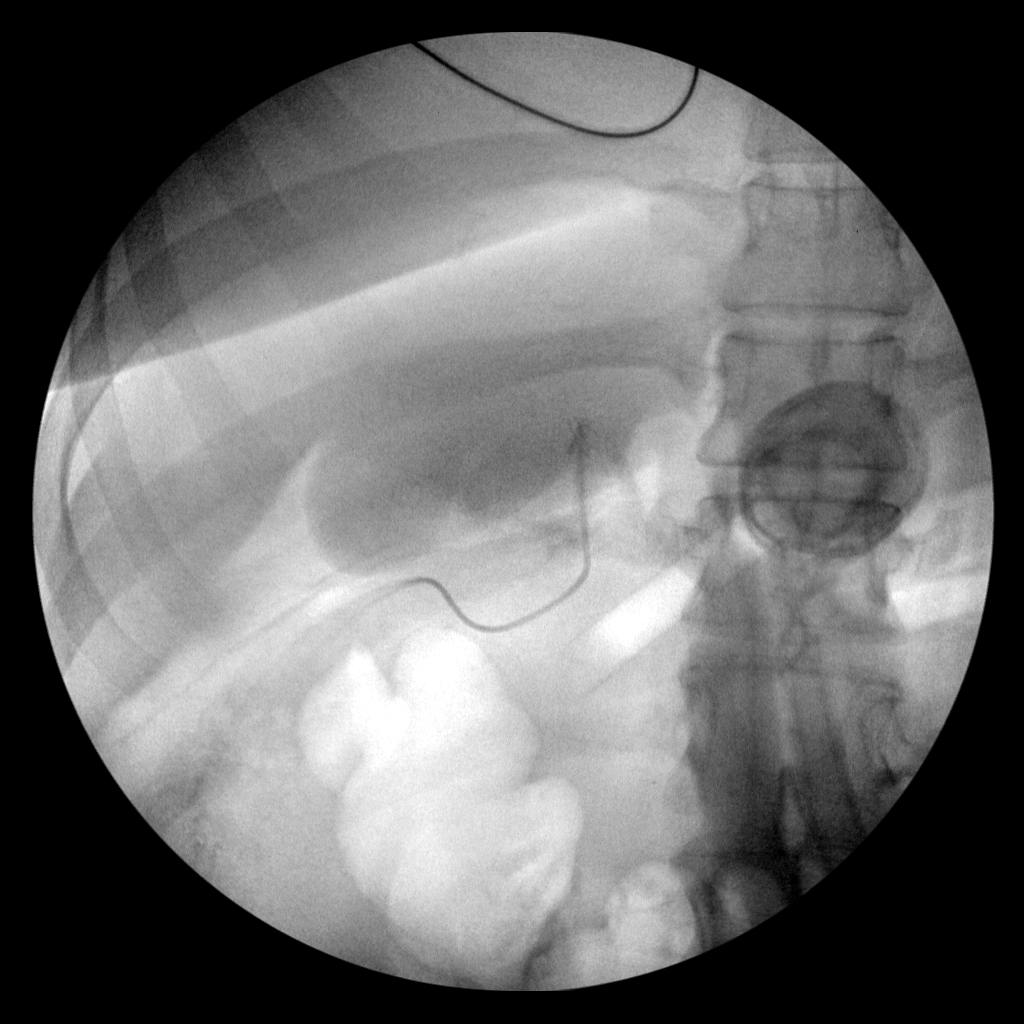
[frame 42/82]
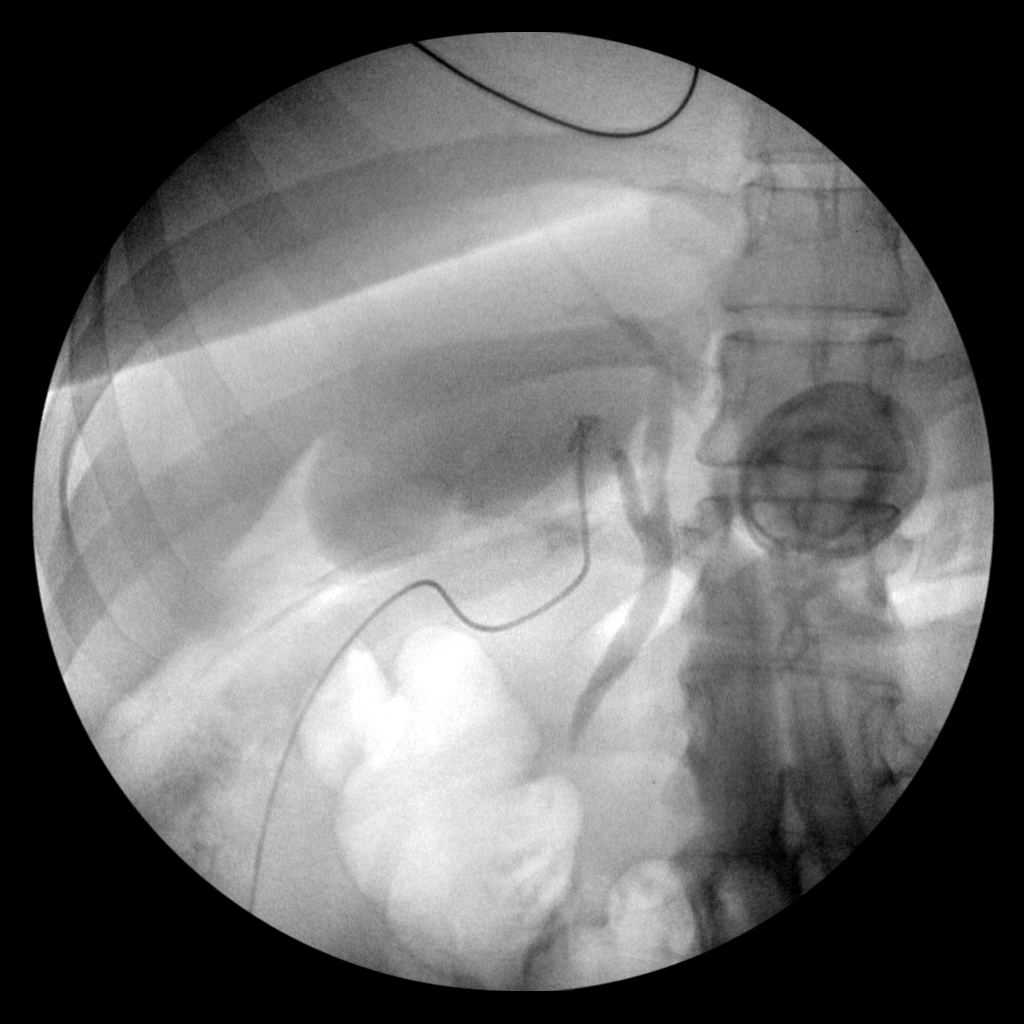
[frame 70/82]
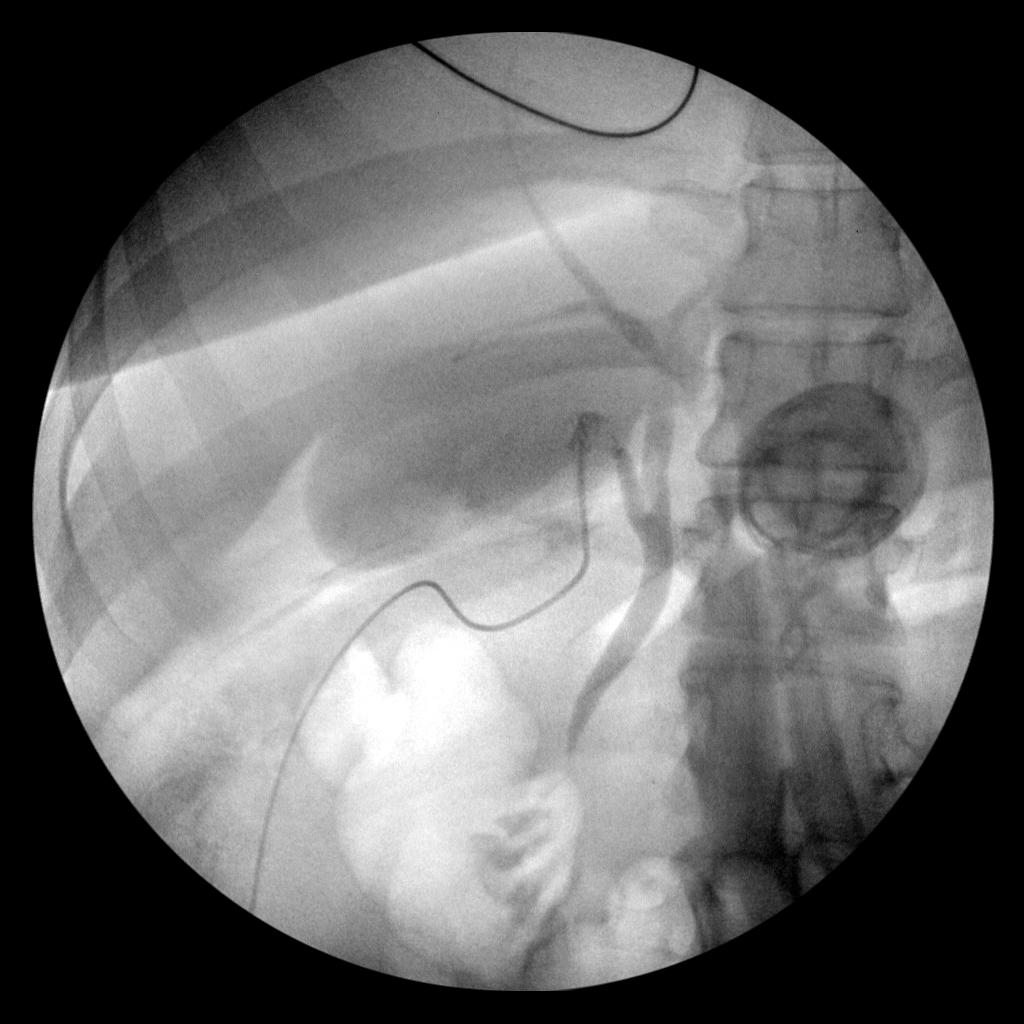
[frame 75/82]
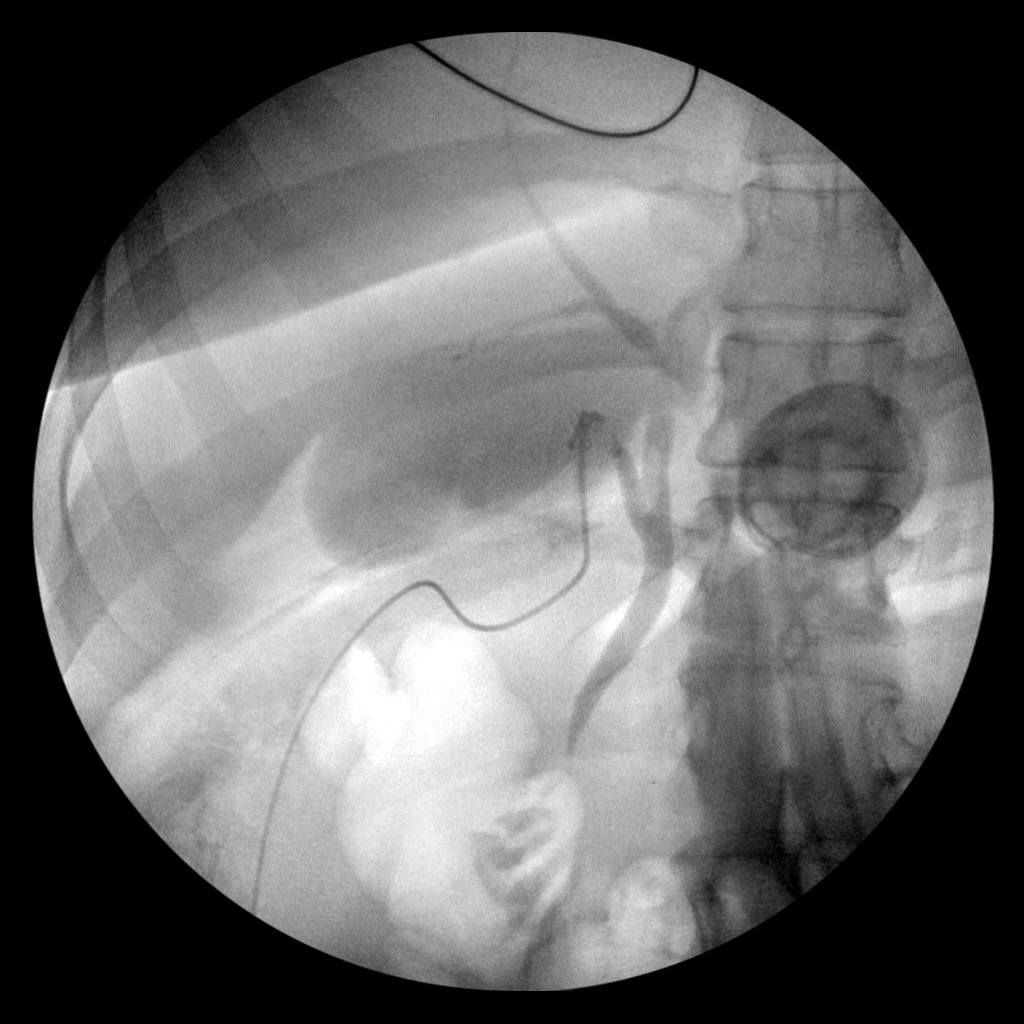

[4 of 4 positions shown; findings below may reference images not displayed]

FINDINGS: Cine run obtained during intraoperative cholangiogram at the time of
laparoscopic cholecystectomy. The images demonstrate cannulation of
the cystic duct remnant with opacification of the biliary tree. No
evidence of biliary ductal dilatation, stenosis, stricture or
filling defect. Contrast material passes through the ampulla and
into the duodenum.
IMPRESSION: Negative intraoperative cholangiogram.
